# Patient Record
Sex: Female | Born: 1935 | Race: White | Hispanic: No | Marital: Single
Health system: Southern US, Community
[De-identification: ages and names within clinical notes are randomized; demographics above are authoritative.]

---

## 2009-06-27 ENCOUNTER — Emergency Department (HOSPITAL_COMMUNITY): Admission: EM | Admit: 2009-06-27 | Discharge: 2009-06-27 | Payer: Self-pay | Admitting: Emergency Medicine

## 2009-10-07 ENCOUNTER — Inpatient Hospital Stay (HOSPITAL_COMMUNITY): Admission: AD | Admit: 2009-10-07 | Discharge: 2009-10-10 | Payer: Self-pay | Admitting: Internal Medicine

## 2010-07-29 IMAGING — CT CT HEAD W/O CM
1 series · 15 of 30 positions shown, 19 images · non-contrast
Comparison: None

CLINICAL DATA: Weakness, altered mental status, difficulty speaking

CT HEAD WITHOUT CONTRAST
TECHNIQUE: Contiguous axial images were obtained from the base of
the skull through the vertex without contrast.

[Series 2: headseq 4.8 h37s · axial · 0.45mm/px · z∈[+44,+194]mm · 15 of 35 slices shown, 19 images]
[im 2/35  brain]
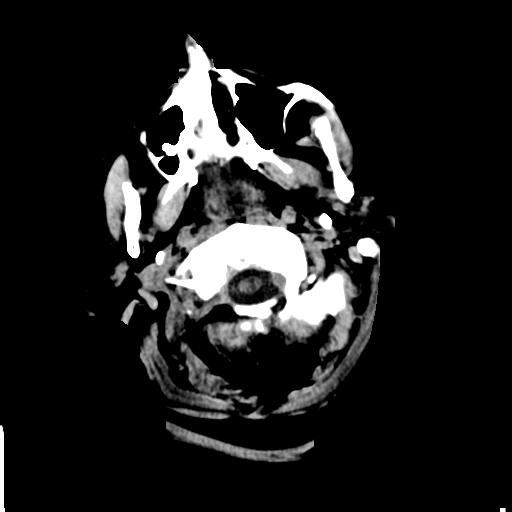
[im 2/35  bone]
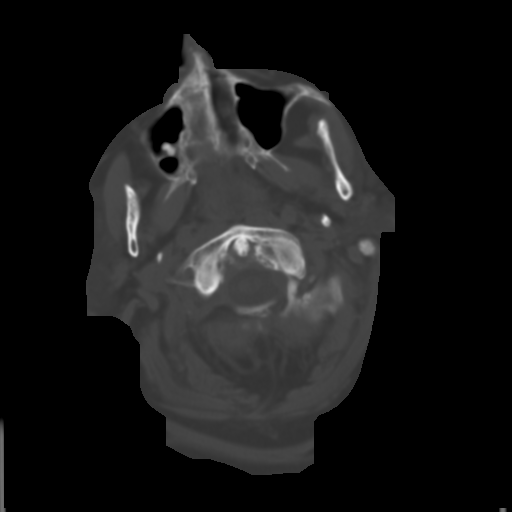
[im 4/35  brain]
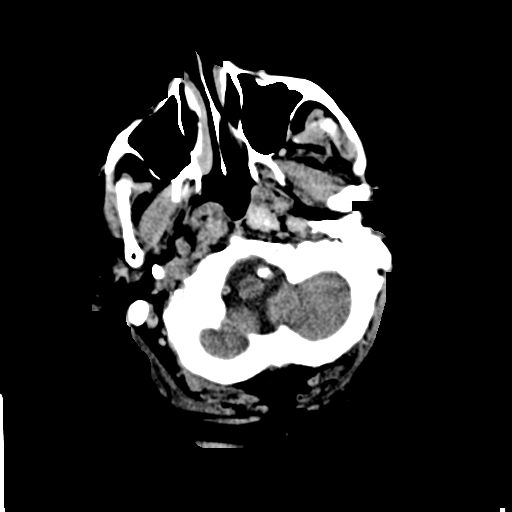
[im 6/35  brain]
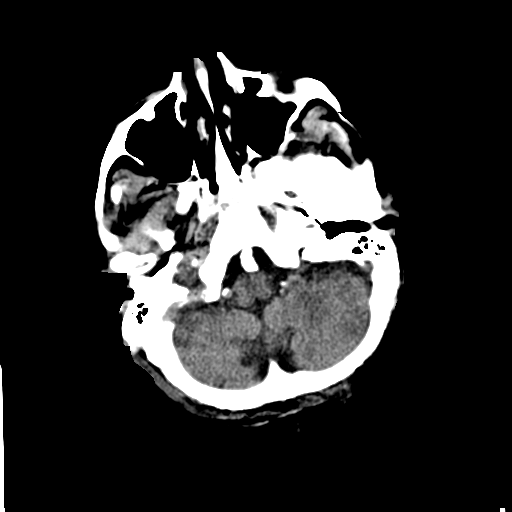
[im 9/35  brain]
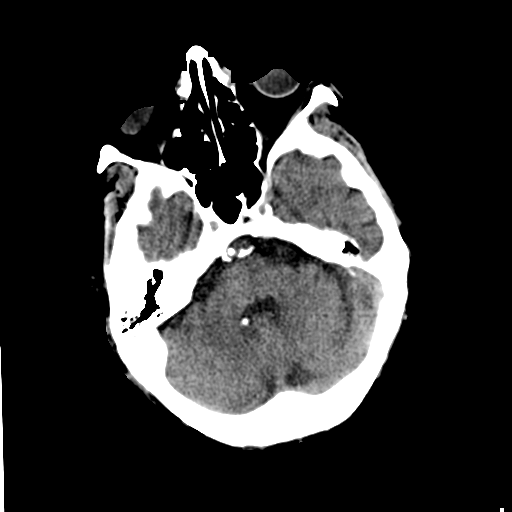
[im 11/35  brain]
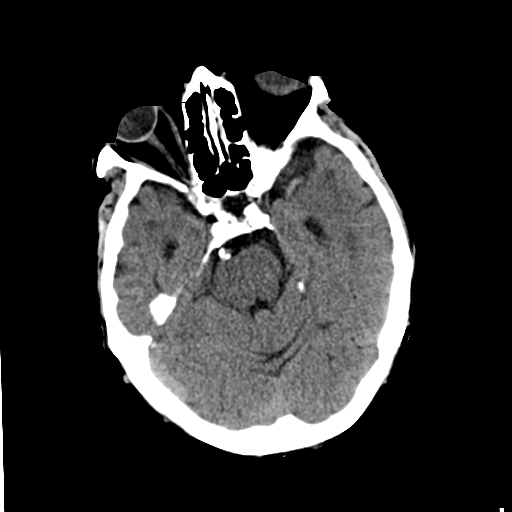
[im 11/35  bone]
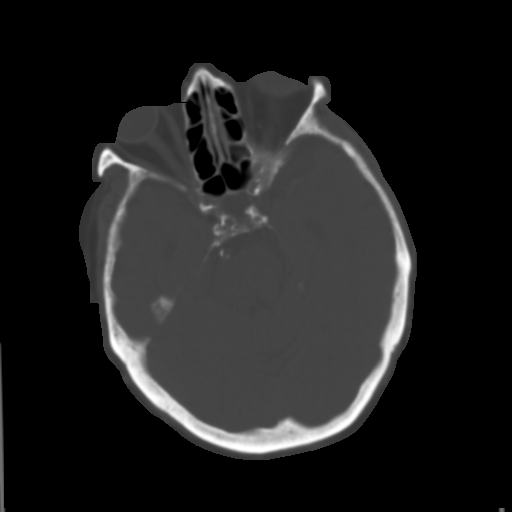
[im 13/35  brain]
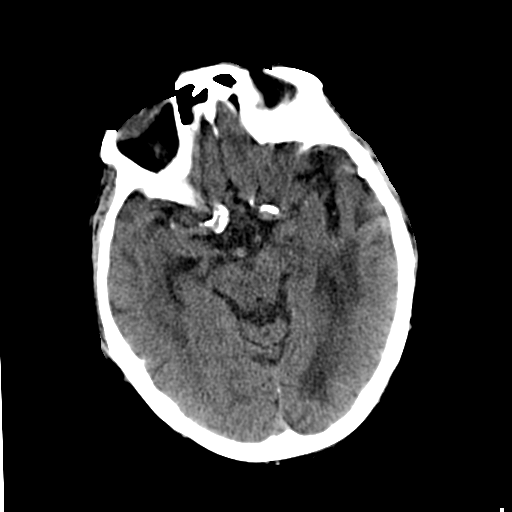
[im 16/35  brain]
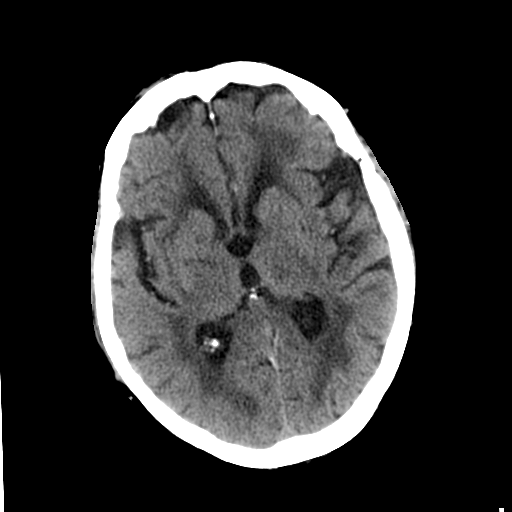
[im 18/35  brain]
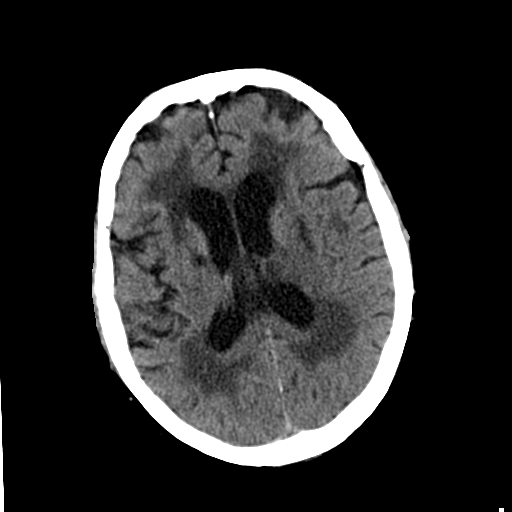
[im 19/35  brain]
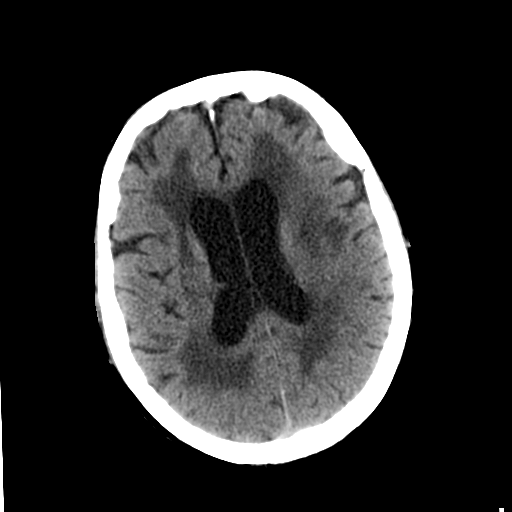
[im 19/35  bone]
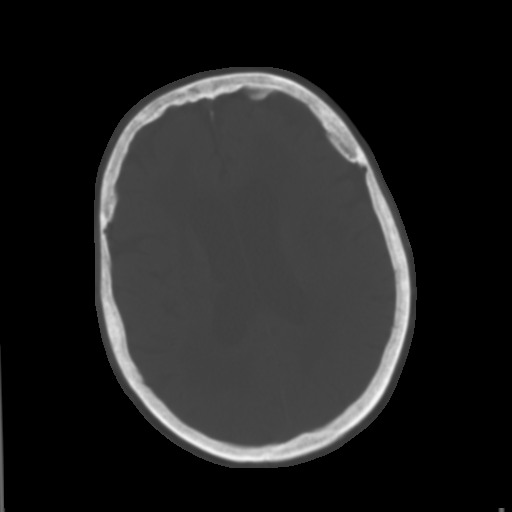
[im 22/35  brain]
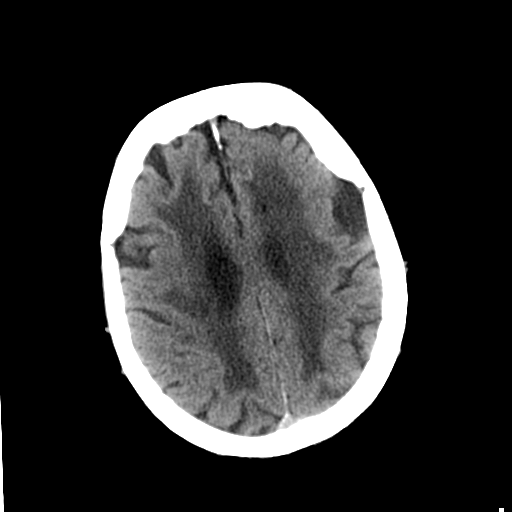
[im 24/35  brain]
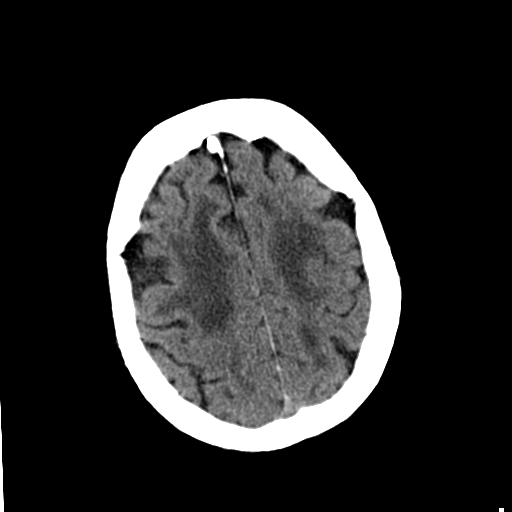
[im 26/35  brain]
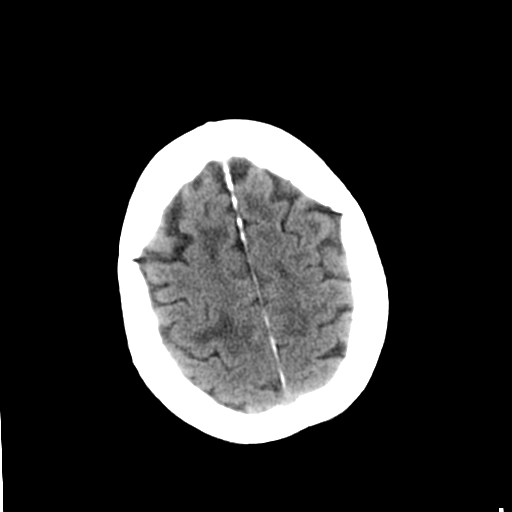
[im 29/35  brain]
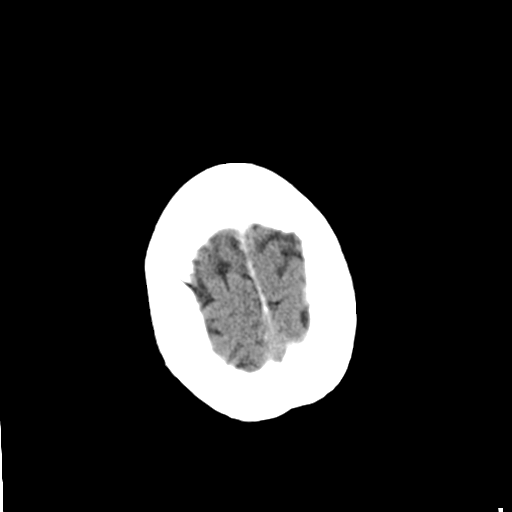
[im 29/35  bone]
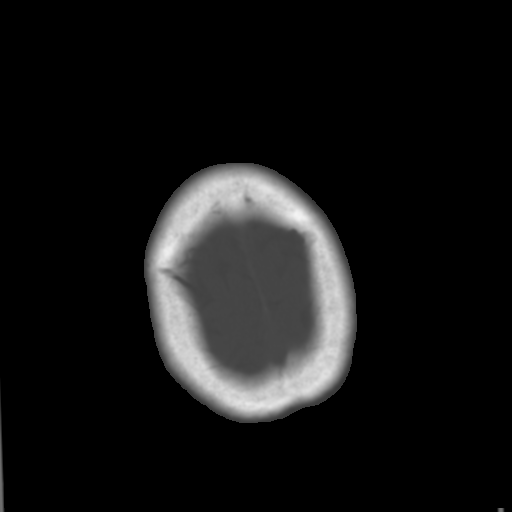
[im 31/35  brain]
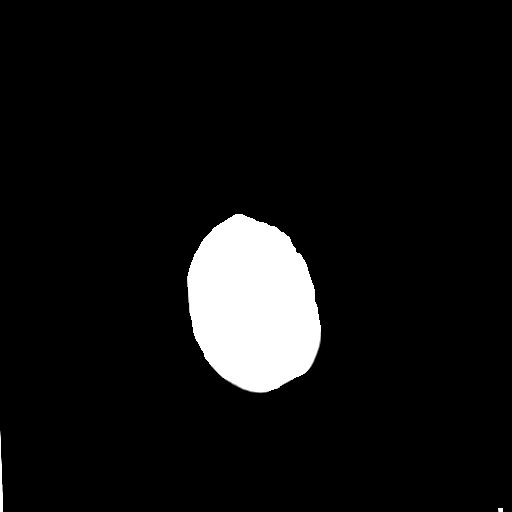
[im 33/35  brain]
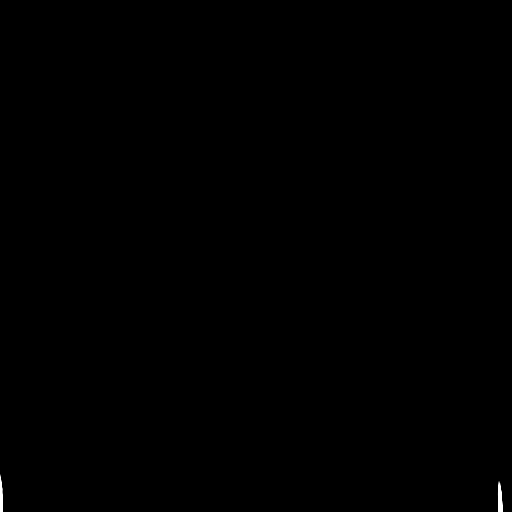

[15 of 30 positions shown; findings below may reference images not displayed]

FINDINGS: Generalized atrophy.
Normal ventricular morphology.
No midline shift or mass effect.
Extensive small vessel chronic ischemic changes of deep cerebral
white matter in both hemispheres.
Old bilateral basal ganglia lacunar infarcts.
No intracranial hemorrhage, mass lesion, or acute infarction.
Extensive atherosclerotic calcifications of vertebral and internal
carotid arteries bilaterally.
Focal abnormal appearance of right temporal bone posterior and
superior to the right mastoid air cells, where an expansile ground-
glass type bony lesion is identified, 2.6 x 2.0 cm image 8.
Remaining calvarium unremarkable.
IMPRESSION: Atrophy with extensive small vessel chronic ischemic changes of
deep cerebral white matter.
Old bilateral basal ganglia lacunar infarcts.
No acute intracranial abnormalities.
Expansile bone lesion posterior right temporal bone superior and
posterior to the mastoid air cells, showing ground-glass
attenuation, most likely representing fibrous dysplasia.

## 2010-07-29 IMAGING — CR DG CHEST 1V
1 series · 1 of 1 positions shown · non-contrast
Comparison: None

CLINICAL DATA: Altered mental status.  Weakness.  Asthma.

CHEST - 1 VIEW

[view not recorded]
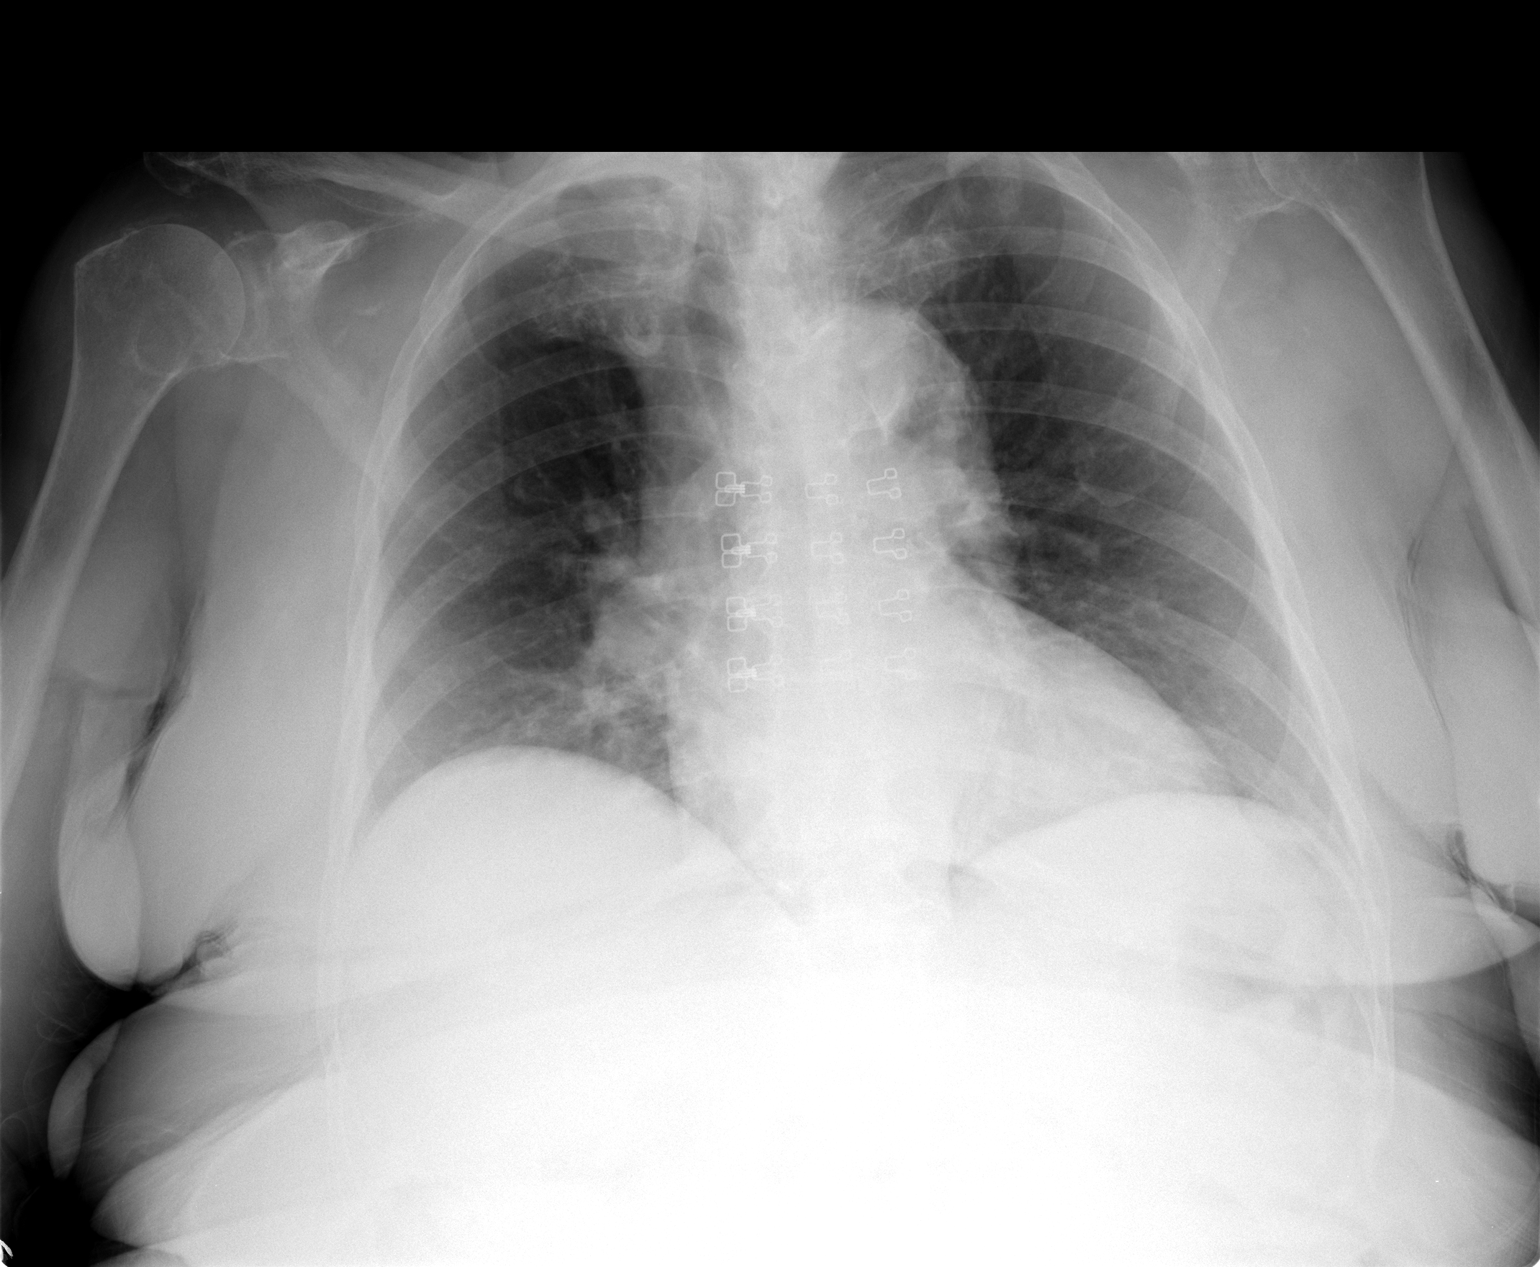

[1 of 1 positions shown; findings below may reference images not displayed]

FINDINGS: Low lung volumes are seen however both lungs are clear.
Heart size is within normal limits allowing for positioning.
Ectasia of the thoracic aorta is noted.
IMPRESSION: Low lung volumes.  No acute findings.

## 2010-10-06 ENCOUNTER — Ambulatory Visit (HOSPITAL_COMMUNITY)
Admission: RE | Admit: 2010-10-06 | Discharge: 2010-10-06 | Payer: Self-pay | Source: Home / Self Care | Attending: Internal Medicine | Admitting: Internal Medicine

## 2011-01-18 LAB — CBC
HCT: 33 % — ABNORMAL LOW (ref 36.0–46.0)
HCT: 33.2 % — ABNORMAL LOW (ref 36.0–46.0)
MCHC: 33.2 g/dL (ref 30.0–36.0)
MCHC: 33.7 g/dL (ref 30.0–36.0)
MCV: 91.5 fL (ref 78.0–100.0)
MCV: 92.3 fL (ref 78.0–100.0)
MCV: 92.4 fL (ref 78.0–100.0)
Platelets: 342 10*3/uL (ref 150–400)
Platelets: 361 10*3/uL (ref 150–400)
Platelets: 375 10*3/uL (ref 150–400)
Platelets: 390 10*3/uL (ref 150–400)
RDW: 14 % (ref 11.5–15.5)
RDW: 14.1 % (ref 11.5–15.5)
RDW: 14.2 % (ref 11.5–15.5)

## 2011-01-18 LAB — BASIC METABOLIC PANEL
BUN: 3 mg/dL — ABNORMAL LOW (ref 6–23)
BUN: 5 mg/dL — ABNORMAL LOW (ref 6–23)
CO2: 29 mEq/L (ref 19–32)
Calcium: 8.4 mg/dL (ref 8.4–10.5)
Calcium: 8.5 mg/dL (ref 8.4–10.5)
Chloride: 101 mEq/L (ref 96–112)
Creatinine, Ser: 0.41 mg/dL (ref 0.4–1.2)
Creatinine, Ser: 0.45 mg/dL (ref 0.4–1.2)
GFR calc non Af Amer: >60 mL/min (ref >60)
Glucose, Bld: 86 mg/dL (ref 70–99)
Potassium: 3.8 mEq/L (ref 3.5–5.1)

## 2011-01-18 LAB — DIFFERENTIAL
Basophils Absolute: 0 10*3/uL (ref 0.0–0.1)
Basophils Absolute: 0 10*3/uL (ref 0.0–0.1)
Basophils Absolute: 0.1 10*3/uL (ref 0.0–0.1)
Basophils Relative: 0 % (ref 0–1)
Basophils Relative: 0 % (ref 0–1)
Eosinophils Absolute: 0.3 10*3/uL (ref 0.0–0.7)
Eosinophils Absolute: 0.4 10*3/uL (ref 0.0–0.7)
Eosinophils Absolute: 0.5 10*3/uL (ref 0.0–0.7)
Eosinophils Relative: 4 % (ref 0–5)
Eosinophils Relative: 4 % (ref 0–5)
Lymphocytes Relative: 51 % — ABNORMAL HIGH (ref 12–46)
Neutrophils Relative %: 36 % — ABNORMAL LOW (ref 43–77)
Neutrophils Relative %: 48 % (ref 43–77)

## 2011-01-18 LAB — PROTIME-INR
INR: 1.32 (ref 0.00–1.49)
Prothrombin Time: 16.3 seconds — ABNORMAL HIGH (ref 11.6–15.2)
Prothrombin Time: 25.1 seconds — ABNORMAL HIGH (ref 11.6–15.2)

## 2011-01-18 LAB — HEPARIN LEVEL (UNFRACTIONATED)
Heparin Unfractionated: 0.22 IU/mL — ABNORMAL LOW (ref 0.30–0.70)
Heparin Unfractionated: 0.32 IU/mL (ref 0.30–0.70)
Heparin Unfractionated: 0.35 IU/mL (ref 0.30–0.70)
Heparin Unfractionated: 0.49 IU/mL (ref 0.30–0.70)

## 2011-01-22 LAB — COMPREHENSIVE METABOLIC PANEL
Albumin: 2.9 g/dL — ABNORMAL LOW (ref 3.5–5.2)
BUN: 12 mg/dL (ref 6–23)
Creatinine, Ser: 0.61 mg/dL (ref 0.4–1.2)
Potassium: 3.3 mEq/L — ABNORMAL LOW (ref 3.5–5.1)
Total Protein: 6.4 g/dL (ref 6.0–8.3)

## 2011-01-22 LAB — URINALYSIS, ROUTINE W REFLEX MICROSCOPIC
Bilirubin Urine: NEGATIVE
Protein, ur: NEGATIVE mg/dL
Specific Gravity, Urine: 1.02 (ref 1.005–1.030)
Urobilinogen, UA: 1 mg/dL (ref 0.0–1.0)

## 2011-01-22 LAB — POCT CARDIAC MARKERS
CKMB, poc: <1 ng/mL — ABNORMAL LOW (ref 1.0–8.0)
Myoglobin, poc: 51 ng/mL (ref 12–200)

## 2011-01-22 LAB — CBC
Hemoglobin: 13.3 g/dL (ref 12.0–15.0)
MCHC: 34.8 g/dL (ref 30.0–36.0)
RBC: 4.18 MIL/uL (ref 3.87–5.11)
WBC: 9.6 10*3/uL (ref 4.0–10.5)

## 2011-01-22 LAB — URINE MICROSCOPIC-ADD ON

## 2012-01-10 ENCOUNTER — Other Ambulatory Visit (HOSPITAL_COMMUNITY): Payer: Self-pay | Admitting: Internal Medicine

## 2012-01-11 ENCOUNTER — Ambulatory Visit (HOSPITAL_COMMUNITY)
Admission: RE | Admit: 2012-01-11 | Discharge: 2012-01-11 | Disposition: A | Discharge status: Home / Self Care | Payer: PRIVATE HEALTH INSURANCE | Source: Ambulatory Visit | Attending: Internal Medicine | Admitting: Internal Medicine

## 2012-01-11 DIAGNOSIS — R1310 Dysphagia, unspecified: Secondary | ICD-10-CM | POA: Insufficient documentation

## 2012-01-11 DIAGNOSIS — I10 Essential (primary) hypertension: Secondary | ICD-10-CM | POA: Insufficient documentation

## 2012-01-11 DIAGNOSIS — IMO0001 Reserved for inherently not codable concepts without codable children: Secondary | ICD-10-CM | POA: Insufficient documentation

## 2012-01-11 NOTE — Procedures (Signed)
Objective Swallowing Evaluation: Modified Barium Swallowing Study /Outpatient Patient Details  Name: MAYELIN PANOS MRN: [id number] Date of Birth: May 22, 1919  Today's Date: 01/11/2012 Time: 4098-1191 Time Calculation (min): 27 min  Past Medical History: No past medical history on file. Past Surgical History: No past surgical history on file. HPI:  See subjective note. 78 yo female referred by Dr. Felecia Shelling for MBSS due to coughing with po nearly every meal. PMH significant for dementia, HTN, GI bleed, DVT, GERD, and hypokalemia.  Symptoms/Limitations Symptoms: Mrs. Hanna Ra is a 78 year old female resident at [facility] who was refered for MBSS due to reports of patient choking/coughing nearly every meal. She was accompanied to the appointment by her daughter. Mrs. Rivere last had MBSS here 10/07/2011 with recommendation for mech soft and thin liquids.  Special Tests: MBSS  Recommendation/Prognosis  Clinical Impression Dysphagia Diagnosis: Mild pharyngeal phase dysphagia Clinical impression: Mild pharyngeal phase dysphagia with premature spillage with liquids, min delay in initiation, decreased tongue base retraction, with variable flash penetration with thins and nectarts (actually was deeper with nectars likely exerting more suction with the straw) without aspiration. Pt did cough a couple of times, however no aspiration was witnessed. Chin tuck was extremely effective in preventing and removing puree and mech soft residue from valleculae as well as eliminating penetration with thin liquids. Recommned mech soft with thin liquids (straw ok) with strict use of chin tuck for all swallows. Pt did really well with this when cup with straw was held low near her belly. Swallow Evaluation Recommendations Diet Recommendations: Dysphagia 3 (Mechanical Soft);Thin liquid Liquid Administration via: Straw Medication Administration:  (crush large pills with puree) Supervision: Patient  able to self feed;Full supervision/cueing for compensatory strategies Compensations: Slow rate;Small sips/bites Postural Changes and/or Swallow Maneuvers: Seated upright 90 degrees;Upright 30-60 min after meal;Chin tuck Oral Care Recommendations: Oral care BID;Staff/trained caregiver to provide oral care Other Recommendations: Clarify dietary restrictions Follow up Recommendations: Skilled Nursing facility   Individuals Consulted Consulted and Agree with Results and Recommendations: Patient;Family member/caregiver Family Member Consulted: Daughter Report Sent to : Facility (Comment) ([facility])  SLP Assessment/Plan Dysphagia Diagnosis: Mild pharyngeal phase dysphagia Clinical impression: Mild pharyngeal phase dysphagia with premature spillage with liquids, min delay in initiation, decreased tongue base retraction, with variable flash penetration with thins and nectarts (actually was deeper with nectars likely exerting more suction with the straw) without aspiration. Pt did cough a couple of times, however no aspiration was witnessed. Chin tuck was extremely effective in preventing and removing puree and mech soft residue from valleculae as well as eliminating penetration with thin liquids. Recommned mech soft with thin liquids (straw ok) with strict use of chin tuck for all swallows. Pt did really well with this when cup with straw was held low near her belly.   General:  Date of Onset: 01/10/12 HPI: See subjective note. 78 yo female referred by Dr. Felecia Shelling for MBSS due to coughing with po nearly every meal. PMH significant for dementia, HTN, GI bleed, DVT, GERD, and hypokalemia. Type of Study: Modified Barium Swallowing Study Previous Swallow Assessment: MBSS 10/06/2010 with rec for mech soft and thin Diet Prior to this Study: Dysphagia 3 (soft);Thin liquids Temperature Spikes Noted: No Respiratory Status: Room air History of Intubation: No Behavior/Cognition: Alert;Cooperative;Pleasant  mood Oral Cavity - Dentition:  (some sparse dentition, but adequate) Oral Motor / Sensory Function: Within functional limits Vision: Functional for self-feeding Patient Positioning: Upright in chair Baseline Vocal Quality: Clear Volitional Cough: Strong Volitional Swallow:  Able to elicit Anatomy: Within functional limits Pharyngeal Secretions: Not observed secondary MBS  Reason for Referral:  Objectively evaluate swallow function due to concerns of possible aspiration and identify appropriate diet and compensatory strategies as needed.   Oral Phase Oral Preparation/Oral Phase Oral Phase: WFL  Pharyngeal Phase  Pharyngeal Phase Pharyngeal Phase: Impaired Pharyngeal - Nectar Pharyngeal - Nectar Straw: Premature spillage to valleculae;Premature spillage to pyriform;Delayed swallow initiation;Reduced airway/laryngeal closure;Penetration/Aspiration during swallow Penetration/Aspiration details (nectar straw): Material does not enter airway;Material enters airway, remains ABOVE vocal cords then ejected out Pharyngeal - Thin Pharyngeal - Thin Cup: Premature spillage to valleculae;Premature spillage to pyriform;Delayed swallow initiation;Reduced airway/laryngeal closure;Penetration/Aspiration during swallow;Pharyngeal residue - valleculae;Compensatory strategies attempted (Comment) (chin tuck effective in eliminating penetration and clearing ) Penetration/Aspiration details (thin cup): Material does not enter airway;Material enters airway, remains ABOVE vocal cords then ejected out Pharyngeal - Thin Straw: Delayed swallow initiation;Premature spillage to pyriform;Premature spillage to valleculae;Reduced airway/laryngeal closure;Reduced tongue base retraction;Penetration/Aspiration during swallow;Compensatory strategies attempted (Comment) (chin tuck) Penetration/Aspiration details (thin straw): Material does not enter airway;Material enters airway, remains ABOVE vocal cords then ejected  out Pharyngeal - Solids Pharyngeal - Puree: Reduced tongue base retraction;Pharyngeal residue - valleculae;Compensatory strategies attempted (Comment) (chin tuck effective in preventing and clearing valleculae) Pharyngeal - Mechanical Soft: Reduced tongue base retraction;Pharyngeal residue - valleculae Pharyngeal - Pill:  (Stasis of pill just above UES briefly before passing throug)  Cervical Esophageal Phase  Cervical Esophageal Phase Cervical Esophageal Phase: [facility]  Thank you for this referral,  Havery Moros, CCC-SLP [phone]   Maijor Hornig 01/11/2012, 6:26 PM

## 2013-11-18 DEATH — deceased

## 2013-12-24 ENCOUNTER — Telehealth: Payer: Self-pay

## 2013-12-24 NOTE — Telephone Encounter (Signed)
Patient past away @ [facility] per Ileene Hutchinsonbituary in [facility]
